# Patient Record
Sex: Female | Born: 2000
Health system: Southern US, Community
[De-identification: ages and names within clinical notes are randomized; demographics above are authoritative.]

---
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
## 2019-01-01 DIAGNOSIS — F913 Oppositional defiant disorder: Secondary | ICD-10-CM | POA: Diagnosis not present

## 2019-01-08 DIAGNOSIS — F913 Oppositional defiant disorder: Secondary | ICD-10-CM | POA: Diagnosis not present

## 2019-01-28 DIAGNOSIS — F913 Oppositional defiant disorder: Secondary | ICD-10-CM | POA: Diagnosis not present

## 2019-02-25 DIAGNOSIS — F913 Oppositional defiant disorder: Secondary | ICD-10-CM | POA: Diagnosis not present

## 2019-02-28 DIAGNOSIS — R300 Dysuria: Secondary | ICD-10-CM | POA: Diagnosis not present

## 2019-02-28 DIAGNOSIS — Z202 Contact with and (suspected) exposure to infections with a predominantly sexual mode of transmission: Secondary | ICD-10-CM | POA: Diagnosis not present

## 2019-02-28 DIAGNOSIS — Z6824 Body mass index (BMI) 24.0-24.9, adult: Secondary | ICD-10-CM | POA: Diagnosis not present

## 2019-11-03 ENCOUNTER — Other Ambulatory Visit: Payer: Self-pay

## 2020-05-23 ENCOUNTER — Emergency Department (HOSPITAL_BASED_OUTPATIENT_CLINIC_OR_DEPARTMENT_OTHER)
Admission: EM | Admit: 2020-05-23 | Discharge: 2020-05-23 | Disposition: A | Discharge status: Home / Self Care | Payer: 59 | Attending: Emergency Medicine | Admitting: Emergency Medicine

## 2020-05-23 ENCOUNTER — Other Ambulatory Visit: Payer: Self-pay

## 2020-05-23 ENCOUNTER — Emergency Department (HOSPITAL_BASED_OUTPATIENT_CLINIC_OR_DEPARTMENT_OTHER): Payer: 59

## 2020-05-23 ENCOUNTER — Encounter (HOSPITAL_BASED_OUTPATIENT_CLINIC_OR_DEPARTMENT_OTHER): Payer: Self-pay | Admitting: Emergency Medicine

## 2020-05-23 DIAGNOSIS — N3001 Acute cystitis with hematuria: Secondary | ICD-10-CM | POA: Diagnosis not present

## 2020-05-23 DIAGNOSIS — R112 Nausea with vomiting, unspecified: Secondary | ICD-10-CM | POA: Diagnosis present

## 2020-05-23 LAB — CBC WITH DIFFERENTIAL/PLATELET
Abs Immature Granulocytes: 0.09 10*3/uL — ABNORMAL HIGH (ref 0.00–0.07)
Basophils Absolute: 0.1 10*3/uL (ref 0.0–0.1)
Basophils Relative: 0 %
Eosinophils Absolute: 0 10*3/uL (ref 0.0–0.5)
Eosinophils Relative: 0 %
HCT: 39.1 % (ref 36.0–46.0)
Hemoglobin: 13.7 g/dL (ref 12.0–15.0)
Immature Granulocytes: 1 %
Lymphocytes Relative: 3 %
Lymphs Abs: 0.6 10*3/uL — ABNORMAL LOW (ref 0.7–4.0)
MCH: 31.3 pg (ref 26.0–34.0)
MCHC: 35 g/dL (ref 30.0–36.0)
MCV: 89.3 fL (ref 80.0–100.0)
Monocytes Absolute: 0.3 10*3/uL (ref 0.1–1.0)
Monocytes Relative: 2 %
Neutro Abs: 17.8 10*3/uL — ABNORMAL HIGH (ref 1.7–7.7)
Neutrophils Relative %: 94 %
Platelets: 308 10*3/uL (ref 150–400)
RBC: 4.38 MIL/uL (ref 3.87–5.11)
RDW: 11.7 % (ref 11.5–15.5)
WBC: 18.8 10*3/uL — ABNORMAL HIGH (ref 4.0–10.5)
nRBC: 0 % (ref 0.0–0.2)

## 2020-05-23 LAB — LIPASE, BLOOD: Lipase: <10 U/L — ABNORMAL LOW (ref 11–51)

## 2020-05-23 LAB — URINALYSIS, ROUTINE W REFLEX MICROSCOPIC
Bilirubin Urine: NEGATIVE
Glucose, UA: NEGATIVE mg/dL
Ketones, ur: >80 mg/dL — AB
Nitrite: POSITIVE — AB
Protein, ur: 100 mg/dL — AB
Specific Gravity, Urine: 1.03 (ref 1.005–1.030)
WBC, UA: >50 WBC/hpf — ABNORMAL HIGH (ref 0–5)
pH: 8 (ref 5.0–8.0)

## 2020-05-23 LAB — COMPREHENSIVE METABOLIC PANEL
ALT: 22 U/L (ref 0–44)
AST: 21 U/L (ref 15–41)
Albumin: 5 g/dL (ref 3.5–5.0)
Alkaline Phosphatase: 31 U/L — ABNORMAL LOW (ref 38–126)
Anion gap: 16 — ABNORMAL HIGH (ref 5–15)
BUN: 12 mg/dL (ref 6–20)
CO2: 19 mmol/L — ABNORMAL LOW (ref 22–32)
Calcium: 10 mg/dL (ref 8.9–10.3)
Chloride: 101 mmol/L (ref 98–111)
Creatinine, Ser: 0.67 mg/dL (ref 0.44–1.00)
GFR, Estimated: >60 mL/min (ref >60)
Glucose, Bld: 138 mg/dL — ABNORMAL HIGH (ref 70–99)
Potassium: 3.5 mmol/L (ref 3.5–5.1)
Sodium: 136 mmol/L (ref 135–145)
Total Bilirubin: 1.4 mg/dL — ABNORMAL HIGH (ref 0.3–1.2)
Total Protein: 7.3 g/dL (ref 6.5–8.1)

## 2020-05-23 LAB — PREGNANCY, URINE: Preg Test, Ur: NEGATIVE

## 2020-05-23 MED ORDER — PHENAZOPYRIDINE HCL 200 MG PO TABS: 200.0000 mg | ORAL_TABLET | Freq: Three times a day (TID) | ORAL | 0 refills | Status: AC

## 2020-05-23 MED ORDER — SODIUM CHLORIDE 0.9 % IV SOLN: 2.0000 g | Freq: Once | INTRAVENOUS | Status: DC

## 2020-05-23 MED ORDER — KETOROLAC TROMETHAMINE 30 MG/ML IJ SOLN
30.0000 mg | Freq: Once | INTRAMUSCULAR | Status: AC
  Administered 2020-05-23: 30 mg via INTRAVENOUS
  Filled 2020-05-23: qty 1

## 2020-05-23 MED ORDER — SODIUM CHLORIDE 0.9 % IV SOLN
1.0000 g | Freq: Once | INTRAVENOUS | Status: AC
  Administered 2020-05-23: 1 g via INTRAVENOUS
  Filled 2020-05-23: qty 10

## 2020-05-23 MED ORDER — PROCHLORPERAZINE EDISYLATE 10 MG/2ML IJ SOLN
10.0000 mg | Freq: Once | INTRAMUSCULAR | Status: AC
  Administered 2020-05-23: 10 mg via INTRAVENOUS
  Filled 2020-05-23: qty 2

## 2020-05-23 MED ORDER — SODIUM CHLORIDE 0.9 % IV BOLUS
1000.0000 mL | Freq: Once | INTRAVENOUS | Status: AC
  Administered 2020-05-23: 1000 mL via INTRAVENOUS

## 2020-05-23 MED ORDER — ONDANSETRON HCL 4 MG/2ML IJ SOLN
4.0000 mg | Freq: Once | INTRAMUSCULAR | Status: AC
  Administered 2020-05-23: 4 mg via INTRAVENOUS
  Filled 2020-05-23: qty 2

## 2020-05-23 MED ORDER — ONDANSETRON HCL 4 MG PO TABS: 4.0000 mg | ORAL_TABLET | Freq: Four times a day (QID) | ORAL | 0 refills | Status: DC | PRN

## 2020-05-23 MED ORDER — CEPHALEXIN 500 MG PO CAPS: 500.0000 mg | ORAL_CAPSULE | Freq: Two times a day (BID) | ORAL | 0 refills | Status: AC

## 2020-05-23 MED ORDER — IOHEXOL 300 MG/ML  SOLN
100.0000 mL | Freq: Once | INTRAMUSCULAR | Status: AC | PRN
  Administered 2020-05-23: 100 mL via INTRAVENOUS

## 2020-05-23 NOTE — Discharge Instructions (Signed)
Continue antibiotics as prescribed.  Use Zofran as needed for nausea and vomiting.  Recommend continued use of Tylenol Motrin for pain.  Please return to the ED if you have worsening pain, nausea, vomiting, develop persistent fever.  Follow-up with your primary care doctor or OB/GYN if you have persistent symptoms.

## 2020-05-23 NOTE — ED Provider Notes (Signed)
[facility] EMERGENCY DEPT Provider Note   CSN: [id number] Arrival date & time: 05/23/20  1936     History Chief Complaint  Patient presents with  . Emesis  . Nausea    Dawn Esparza is a 20 y.o. female.  The history is provided by the patient.  Emesis Severity:  Mild Duration:  4 hours Timing:  Constant Quality:  Undigested food Progression:  Unchanged Chronicity:  New Relieved by:  Nothing Worsened by:  Nothing Associated symptoms: abdominal pain   Associated symptoms: no arthralgias, no chills, no cough, no diarrhea, no fever, no headaches, no myalgias, no sore throat and no URI   Risk factors: no diabetes, not pregnant, no sick contacts and no suspect food intake        History reviewed. No pertinent past medical history.  There are no problems to display for this patient.     OB History   No obstetric history on file.     No family history on file.  Social History   Tobacco Use  . Smoking status: Never Smoker  . Smokeless tobacco: Never Used  Vaping Use  . Vaping Use: Never used  Substance Use Topics  . Alcohol use: Not Currently  . Drug use: Never    Home Medications Prior to Admission medications   Medication Sig Start Date End Date Taking? Authorizing Provider  cephALEXin (KEFLEX) 500 MG capsule Take 1 capsule (500 mg total) by mouth 2 (two) times daily for 7 days. 05/23/20 05/30/20 Yes Curatolo, Adam, DO  ondansetron (ZOFRAN) 4 MG tablet Take 1 tablet (4 mg total) by mouth every 6 (six) hours as needed for up to 15 doses for nausea or vomiting. 05/23/20  Yes Curatolo, Adam, DO    Allergies    Patient has no known allergies.  Review of Systems   Review of Systems  Constitutional: Negative for chills and fever.  HENT: Negative for ear pain and sore throat.   Eyes: Negative for pain and visual disturbance.  Respiratory: Negative for cough and shortness of breath.   Cardiovascular: Negative for chest pain and palpitations.   Gastrointestinal: Positive for abdominal pain, nausea and vomiting. Negative for abdominal distention, anal bleeding, blood in stool, constipation and diarrhea.  Genitourinary: Positive for dysuria, frequency and urgency. Negative for decreased urine volume, flank pain, hematuria, pelvic pain, vaginal bleeding, vaginal discharge and vaginal pain.  Musculoskeletal: Negative for arthralgias, back pain and myalgias.  Skin: Negative for color change and rash.  Neurological: Negative for seizures, syncope and headaches.  All other systems reviewed and are negative.   Physical Exam Updated Vital Signs  ED Triage Vitals  Enc Vitals Group     BP 05/23/20 1947 124/64     Pulse --      Resp 05/23/20 1947 18     Temp 05/23/20 1947 98.3 F (36.8 C)     Temp Source 05/23/20 1947 Oral     SpO2 05/23/20 1947 100 %     Weight 05/23/20 1946 130 lb (59 kg)     Height 05/23/20 1946 5\' 4"  (1.626 m)     Head Circumference --      Peak Flow --      Pain Score 05/23/20 1945 8     Pain Loc --      Pain Edu? --      Excl. in GC? --     Physical Exam Vitals and nursing note reviewed.  Constitutional:      General: She is  not in acute distress.    Appearance: She is well-developed. She is ill-appearing.  HENT:     Head: Normocephalic and atraumatic.     Mouth/Throat:     Mouth: Mucous membranes are moist.  Eyes:     Conjunctiva/sclera: Conjunctivae normal.     Pupils: Pupils are equal, round, and reactive to light.  Cardiovascular:     Rate and Rhythm: Normal rate and regular rhythm.     Pulses: Normal pulses.     Heart sounds: Normal heart sounds. No murmur heard.   Pulmonary:     Effort: Pulmonary effort is normal. No respiratory distress.     Breath sounds: Normal breath sounds.  Abdominal:     Palpations: Abdomen is soft.     Tenderness: There is abdominal tenderness (TTP RLQ/suprapubic/LLQ).  Musculoskeletal:     Cervical back: Normal range of motion and neck supple.  Skin:     General: Skin is warm and dry.     Capillary Refill: Capillary refill takes less than 2 seconds.  Neurological:     General: No focal deficit present.     Mental Status: She is alert.  Psychiatric:        Mood and Affect: Mood normal.     ED Results / Procedures / Treatments   Labs (all labs ordered are listed, but only abnormal results are displayed) Labs Reviewed  CBC WITH DIFFERENTIAL/PLATELET - Abnormal; Notable for the following components:      Result Value   WBC 18.8 (*)    Neutro Abs 17.8 (*)    Lymphs Abs 0.6 (*)    Abs Immature Granulocytes 0.09 (*)    All other components within normal limits  COMPREHENSIVE METABOLIC PANEL - Abnormal; Notable for the following components:   CO2 19 (*)    Glucose, Bld 138 (*)    Alkaline Phosphatase 31 (*)    Total Bilirubin 1.4 (*)    Anion gap 16 (*)    All other components within normal limits  LIPASE, BLOOD - Abnormal; Notable for the following components:   Lipase <10 (*)    All other components within normal limits  URINALYSIS, ROUTINE W REFLEX MICROSCOPIC - Abnormal; Notable for the following components:   APPearance CLOUDY (*)    Hgb urine dipstick MODERATE (*)    Ketones, ur >80 (*)    Protein, ur 100 (*)    Nitrite POSITIVE (*)    Leukocytes,Ua LARGE (*)    WBC, UA >50 (*)    Bacteria, UA MANY (*)    All other components within normal limits  URINE CULTURE  PREGNANCY, URINE    EKG None  Radiology CT ABDOMEN PELVIS W CONTRAST  Result Date: 05/23/2020 CLINICAL DATA:  Nausea and vomiting. EXAM: CT ABDOMEN AND PELVIS WITH CONTRAST TECHNIQUE: Multidetector CT imaging of the abdomen and pelvis was performed using the standard protocol following bolus administration of intravenous contrast. CONTRAST:  OMNIPAQUE IOHEXOL 300 MG/ML  SOLN COMPARISON:  None. FINDINGS: Lower chest: No acute abnormality. Hepatobiliary: No focal liver abnormality is seen. No gallstones, gallbladder wall thickening, or biliary dilatation.  Pancreas: Unremarkable. No pancreatic ductal dilatation or surrounding inflammatory changes. Spleen: Normal in size without focal abnormality. Adrenals/Urinary Tract: Adrenal glands are unremarkable. Kidneys are normal, without renal calculi, focal lesion, or hydronephrosis. The urinary bladder is partially contracted and subsequently limited in evaluation. Stomach/Bowel: Stomach is within normal limits. Appendix appears normal. No evidence of bowel wall thickening, distention, or inflammatory changes. Vascular/Lymphatic: No significant  vascular findings are present. No enlarged abdominal or pelvic lymph nodes. Reproductive: An IUD is seen within an otherwise normal appearing uterus. The bilateral adnexa are unremarkable. Other: No abdominal wall hernia or abnormality. A small amount of posterior pelvic fluid is seen. Musculoskeletal: No acute or significant osseous findings. IMPRESSION: 1. Small amount of posterior pelvic fluid, likely physiologic. 2. IUD in place. Electronically Signed   By: Aram Candela M.D.   On: 05/23/2020 20:59    Procedures Procedures   Medications Ordered in ED Medications  ondansetron (ZOFRAN) injection 4 mg (4 mg Intravenous Given 05/23/20 2004)  sodium chloride 0.9 % bolus 1,000 mL (1,000 mLs Intravenous New Bag/Given 05/23/20 2004)  cefTRIAXone (ROCEPHIN) 1 g in sodium chloride 0.9 % 100 mL IVPB (1 g Intravenous New Bag/Given 05/23/20 2045)  iohexol (OMNIPAQUE) 300 MG/ML solution 100 mL (100 mLs Intravenous Contrast Given 05/23/20 2033)  ketorolac (TORADOL) 30 MG/ML injection 30 mg (30 mg Intravenous Given 05/23/20 2113)  prochlorperazine (COMPAZINE) injection 10 mg (10 mg Intravenous Given 05/23/20 2113)    ED Course  I have reviewed the triage vital signs and the nursing notes.  Pertinent labs & imaging results that were available during my care of the patient were reviewed by me and considered in my medical decision making (see chart for details).    MDM  Rules/Calculators/A&P                          Finlee Concepcion is here with abdominal pain, nausea, vomiting.  UTI symptoms as well.  Has some blood in her urine.  Normal vitals, no fever.  Lower abdominal pain on exam.  Has had nonstop nausea and vomiting for the last 4 hours.  Last mental cycle was about a week ago.  No history of kidney stones.  Appears uncomfortable on exam.  Suspect stone versus cystitis versus appendicitis.  Will get basic labs and CT scan abdomen pelvis.  Seems less likely ovarian cyst or torsion.    Urinalysis consistent with infection.  White count of 18.  However no fever.  No tachycardia.  No concern for sepsis.  Electrolytes are overall unremarkable.  Pregnancy test negative.  CT scan negative for kidney stone or appendicitis.  No pyelonephritis seen on CT scan.  Overall suspect a hemorrhagic cystitis.  Feeling much better after IV fluids, IV pain medication and IV antiemetics.  Will start patient on Keflex.  She got a dose of IV Rocephin while in the ED.  She understands return precautions.  Will discharge also with Zofran.  This chart was dictated using voice recognition software.  Despite best efforts to proofread,  errors can occur which can change the documentation meaning.   Final Clinical Impression(s) / ED Diagnoses Final diagnoses:  Acute cystitis with hematuria    Rx / DC Orders ED Discharge Orders         Ordered    cephALEXin (KEFLEX) 500 MG capsule  2 times daily        05/23/20 2114    ondansetron (ZOFRAN) 4 MG tablet  Every 6 hours PRN        05/23/20 2114           Virgina Norfolk, DO 05/23/20 2116

## 2020-05-23 NOTE — ED Triage Notes (Signed)
  Patient started having N/V earlier today around noon.  Called OB and got prescription for macrobid because she was having UTI symptoms as well.  Patient unable to keep anything down and feeling syncopal.  Pain 8/10 cramping in around umbilicus.

## 2020-05-26 LAB — URINE CULTURE: Culture: >=100000 — AB

## 2020-06-14 ENCOUNTER — Emergency Department (HOSPITAL_BASED_OUTPATIENT_CLINIC_OR_DEPARTMENT_OTHER)
Admission: EM | Admit: 2020-06-14 | Discharge: 2020-06-14 | Disposition: A | Discharge status: Home / Self Care | Payer: 59 | Attending: Emergency Medicine | Admitting: Emergency Medicine

## 2020-06-14 ENCOUNTER — Encounter (HOSPITAL_BASED_OUTPATIENT_CLINIC_OR_DEPARTMENT_OTHER): Payer: Self-pay

## 2020-06-14 ENCOUNTER — Other Ambulatory Visit: Payer: Self-pay

## 2020-06-14 ENCOUNTER — Emergency Department (HOSPITAL_BASED_OUTPATIENT_CLINIC_OR_DEPARTMENT_OTHER): Payer: 59

## 2020-06-14 DIAGNOSIS — R112 Nausea with vomiting, unspecified: Secondary | ICD-10-CM

## 2020-06-14 DIAGNOSIS — R1033 Periumbilical pain: Secondary | ICD-10-CM | POA: Diagnosis not present

## 2020-06-14 LAB — CBC WITH DIFFERENTIAL/PLATELET
Abs Immature Granulocytes: 0.08 10*3/uL — ABNORMAL HIGH (ref 0.00–0.07)
Basophils Absolute: 0 10*3/uL (ref 0.0–0.1)
Basophils Relative: 0 %
Eosinophils Absolute: 0 10*3/uL (ref 0.0–0.5)
Eosinophils Relative: 0 %
HCT: 40 % (ref 36.0–46.0)
Hemoglobin: 13.7 g/dL (ref 12.0–15.0)
Immature Granulocytes: 1 %
Lymphocytes Relative: 6 %
Lymphs Abs: 1.1 10*3/uL (ref 0.7–4.0)
MCH: 30.6 pg (ref 26.0–34.0)
MCHC: 34.3 g/dL (ref 30.0–36.0)
MCV: 89.3 fL (ref 80.0–100.0)
Monocytes Absolute: 0.7 10*3/uL (ref 0.1–1.0)
Monocytes Relative: 4 %
Neutro Abs: 15.4 10*3/uL — ABNORMAL HIGH (ref 1.7–7.7)
Neutrophils Relative %: 89 %
Platelets: 304 10*3/uL (ref 150–400)
RBC: 4.48 MIL/uL (ref 3.87–5.11)
RDW: 11.5 % (ref 11.5–15.5)
WBC: 17.3 10*3/uL — ABNORMAL HIGH (ref 4.0–10.5)
nRBC: 0 % (ref 0.0–0.2)

## 2020-06-14 LAB — URINALYSIS, ROUTINE W REFLEX MICROSCOPIC
Bilirubin Urine: NEGATIVE
Glucose, UA: NEGATIVE mg/dL
Ketones, ur: 40 mg/dL — AB
Nitrite: NEGATIVE
Protein, ur: 30 mg/dL — AB
Specific Gravity, Urine: 1.017 (ref 1.005–1.030)
pH: 8.5 — ABNORMAL HIGH (ref 5.0–8.0)

## 2020-06-14 LAB — COMPREHENSIVE METABOLIC PANEL
ALT: 15 U/L (ref 0–44)
AST: 17 U/L (ref 15–41)
Albumin: 4.9 g/dL (ref 3.5–5.0)
Alkaline Phosphatase: 36 U/L — ABNORMAL LOW (ref 38–126)
Anion gap: 16 — ABNORMAL HIGH (ref 5–15)
BUN: 9 mg/dL (ref 6–20)
CO2: 20 mmol/L — ABNORMAL LOW (ref 22–32)
Calcium: 10.1 mg/dL (ref 8.9–10.3)
Chloride: 102 mmol/L (ref 98–111)
Creatinine, Ser: 0.66 mg/dL (ref 0.44–1.00)
GFR, Estimated: >60 mL/min (ref >60)
Glucose, Bld: 141 mg/dL — ABNORMAL HIGH (ref 70–99)
Potassium: 3.3 mmol/L — ABNORMAL LOW (ref 3.5–5.1)
Sodium: 138 mmol/L (ref 135–145)
Total Bilirubin: 0.6 mg/dL (ref 0.3–1.2)
Total Protein: 7.5 g/dL (ref 6.5–8.1)

## 2020-06-14 LAB — URINALYSIS, MICROSCOPIC (REFLEX)

## 2020-06-14 LAB — LIPASE, BLOOD: Lipase: <10 U/L — ABNORMAL LOW (ref 11–51)

## 2020-06-14 LAB — PREGNANCY, URINE: Preg Test, Ur: NEGATIVE

## 2020-06-14 MED ORDER — LACTATED RINGERS IV BOLUS
1000.0000 mL | Freq: Once | INTRAVENOUS | Status: AC
  Administered 2020-06-14: 1000 mL via INTRAVENOUS

## 2020-06-14 MED ORDER — METOCLOPRAMIDE HCL 5 MG/ML IJ SOLN
10.0000 mg | Freq: Once | INTRAMUSCULAR | Status: AC
  Administered 2020-06-14: 10 mg via INTRAVENOUS
  Filled 2020-06-14: qty 2

## 2020-06-14 MED ORDER — SODIUM CHLORIDE 0.9 % IV SOLN: 1.0000 g | Freq: Once | INTRAVENOUS | Status: DC

## 2020-06-14 MED ORDER — MORPHINE SULFATE (PF) 4 MG/ML IV SOLN
4.0000 mg | Freq: Once | INTRAVENOUS | Status: AC
  Administered 2020-06-14: 4 mg via INTRAVENOUS
  Filled 2020-06-14: qty 1

## 2020-06-14 MED ORDER — PANTOPRAZOLE SODIUM 20 MG PO TBEC: 20.0000 mg | DELAYED_RELEASE_TABLET | Freq: Every day | ORAL | 0 refills | Status: AC

## 2020-06-14 MED ORDER — IOHEXOL 300 MG/ML  SOLN
75.0000 mL | Freq: Once | INTRAMUSCULAR | Status: AC | PRN
  Administered 2020-06-14: 75 mL via INTRAVENOUS

## 2020-06-14 MED ORDER — HALOPERIDOL LACTATE 5 MG/ML IJ SOLN
2.0000 mg | Freq: Once | INTRAMUSCULAR | Status: AC
  Administered 2020-06-14: 2 mg via INTRAVENOUS
  Filled 2020-06-14: qty 1

## 2020-06-14 NOTE — ED Provider Notes (Signed)
[facility] EMERGENCY DEPT Provider Note   CSN: [id number] Arrival date & time: 06/14/20  1548     History Chief Complaint  Patient presents with  . Nausea  . Emesis    Dawn Esparza is a 20 y.o. female.  Patient is a 20 year old female with no significant past medical history presenting today due to periumbilical abdominal pain nausea and vomiting that started approximately 2 hours ago.  Patient reports when she woke up this morning she did not feel quite right but was not having any significant pain.  She went to a brewery today and had to beers and started feeling worse.  2 hours ago she started having severe periumbilical pain and has vomited more than 15 times.  She did have one hard stool prior to arrival but denies a history of constipation.  Usually has a bowel movement almost daily.  She does report very similar symptoms exactly 1 month ago and at that time was found to have a UTI but denies any vaginal discharge, dysuria, frequency or urgency.  She has no flank pain.  She has not had fever.  LMP was 1 week ago.  Patient denies any marijuana or drug use.  She does not use tobacco products.  No prior abdominal surgeries.   Emesis      History reviewed. No pertinent past medical history.  There are no problems to display for this patient.   History reviewed. No pertinent surgical history.   OB History   No obstetric history on file.     History reviewed. No pertinent family history.  Social History   Tobacco Use  . Smoking status: Never Smoker  . Smokeless tobacco: Never Used  Vaping Use  . Vaping Use: Never used  Substance Use Topics  . Alcohol use: Not Currently  . Drug use: Never    Home Medications Prior to Admission medications   Medication Sig Start Date End Date Taking? Authorizing Provider  ondansetron (ZOFRAN) 4 MG tablet Take 1 tablet (4 mg total) by mouth every 6 (six) hours as needed for up to 15 doses for nausea or vomiting.  05/23/20   Curatolo, Adam, DO  phenazopyridine (PYRIDIUM) 200 MG tablet Take 1 tablet (200 mg total) by mouth 3 (three) times daily. 05/23/20   Virgina Norfolk, DO    Allergies    Patient has no known allergies.  Review of Systems   Review of Systems  Gastrointestinal: Positive for vomiting.  All other systems reviewed and are negative.   Physical Exam Updated Vital Signs BP 119/81 (BP Location: Right Arm)   Pulse 85   Temp 98.4 F (36.9 C) (Oral)   Resp 18   LMP 05/16/2020 Comment: neg preg test  SpO2 100%   Physical Exam Vitals and nursing note reviewed.  Constitutional:      General: She is not in acute distress.    Appearance: Normal appearance. She is well-developed and normal weight.  HENT:     Head: Normocephalic and atraumatic.     Mouth/Throat:     Mouth: Mucous membranes are moist.  Eyes:     Pupils: Pupils are equal, round, and reactive to light.  Cardiovascular:     Rate and Rhythm: Normal rate and regular rhythm.     Heart sounds: Normal heart sounds. No murmur heard. No friction rub.  Pulmonary:     Effort: Pulmonary effort is normal.     Breath sounds: Normal breath sounds. No wheezing or rales.  Abdominal:  General: Bowel sounds are normal. There is no distension.     Palpations: Abdomen is soft.     Tenderness: There is abdominal tenderness in the epigastric area and periumbilical area. There is no right CVA tenderness, left CVA tenderness, guarding or rebound.  Musculoskeletal:        General: No tenderness. Normal range of motion.     Right lower leg: No edema.     Left lower leg: No edema.     Comments: No edema  Skin:    General: Skin is warm and dry.     Findings: No rash.  Neurological:     Mental Status: She is alert and oriented to person, place, and time. Mental status is at baseline.     Cranial Nerves: No cranial nerve deficit.  Psychiatric:        Mood and Affect: Mood normal.        Behavior: Behavior normal.        Thought  Content: Thought content normal.     ED Results / Procedures / Treatments   Labs (all labs ordered are listed, but only abnormal results are displayed) Labs Reviewed  URINALYSIS, ROUTINE W REFLEX MICROSCOPIC - Abnormal; Notable for the following components:      Result Value   APPearance HAZY (*)    pH 8.5 (*)    Hgb urine dipstick MODERATE (*)    Ketones, ur 40 (*)    Protein, ur 30 (*)    Leukocytes,Ua LARGE (*)    All other components within normal limits  CBC WITH DIFFERENTIAL/PLATELET - Abnormal; Notable for the following components:   WBC 17.3 (*)    Neutro Abs 15.4 (*)    Abs Immature Granulocytes 0.08 (*)    All other components within normal limits  COMPREHENSIVE METABOLIC PANEL - Abnormal; Notable for the following components:   Potassium 3.3 (*)    CO2 20 (*)    Glucose, Bld 141 (*)    Alkaline Phosphatase 36 (*)    Anion gap 16 (*)    All other components within normal limits  LIPASE, BLOOD - Abnormal; Notable for the following components:   Lipase <10 (*)    All other components within normal limits  URINALYSIS, MICROSCOPIC (REFLEX) - Abnormal; Notable for the following components:   Bacteria, UA MANY (*)    Crystals AMORPHOUS URATES/PHOSPHATES (*)    All other components within normal limits  URINE CULTURE  PREGNANCY, URINE    EKG EKG Interpretation  Date/Time:  Thursday June 14 2020 19:47:15 EDT Ventricular Rate:  91 PR Interval:  172 QRS Duration: 103 QT Interval:  389 QTC Calculation: 479 R Axis:   98 Text Interpretation: Sinus rhythm Consider right ventricular hypertrophy No previous tracing Confirmed by Gwyneth SproutPlunkett, Manessa Buley ([id number]) on 06/14/2020 10:34:23 PM   Radiology CT ABDOMEN PELVIS W CONTRAST  Result Date: 06/14/2020 CLINICAL DATA:  Epigastric pain EXAM: CT ABDOMEN AND PELVIS WITH CONTRAST TECHNIQUE: Multidetector CT imaging of the abdomen and pelvis was performed using the standard protocol following bolus administration of intravenous  contrast. CONTRAST:  75mL OMNIPAQUE IOHEXOL 300 MG/ML  SOLN COMPARISON:  05/23/2020 FINDINGS: Lower chest: Lung bases are clear. No effusions. Heart is normal size. Hepatobiliary: No focal hepatic abnormality. Gallbladder unremarkable. Pancreas: No focal abnormality or ductal dilatation. Spleen: No focal abnormality.  Normal size. Adrenals/Urinary Tract: No adrenal abnormality. No focal renal abnormality. No stones or hydronephrosis. Urinary bladder is unremarkable. Stomach/Bowel: Moderate stool burden in the colon. Normal appendix. Stomach,  large and small bowel grossly unremarkable. Vascular/Lymphatic: No evidence of aneurysm or adenopathy. Reproductive: IUD in the uterus.  No adnexal mass. Other: No free fluid or free air. Musculoskeletal: No acute bony abnormality. IMPRESSION: Moderate stool burden throughout the colon. No acute findings in the abdomen or pelvis. Electronically Signed   By: Charlett Nose M.D.   On: 06/14/2020 21:22    Procedures Procedures   Medications Ordered in ED Medications  morphine 4 MG/ML injection 4 mg (has no administration in time range)  metoCLOPramide (REGLAN) injection 10 mg (has no administration in time range)  lactated ringers bolus 1,000 mL (has no administration in time range)    ED Course  I have reviewed the triage vital signs and the nursing notes.  Pertinent labs & imaging results that were available during my care of the patient were reviewed by me and considered in my medical decision making (see chart for details).    MDM Rules/Calculators/A&P                          Patient presenting with 2 hours of ongoing vomiting and periumbilical pain.  Patient with similar symptoms almost 1 month ago at that time was found to have a UTI.  She did have leftover Zofran at home that she attempted to take but it did not improve her symptoms.  She has no lower abdominal pain today and denies any vaginal or urinary complaints.  She denies use of marijuana to  suggest cannabis hyperemesis syndrome.  Will recheck UA given recent UTI she did take the antibiotics and completed the course.  Lower suspicion for PID or acute pelvic infection as she has no lower abdominal symptoms.  Also this could be viral versus gastritis.  Patient did have 2 alcoholic beverages today but was already not feeling well prior to drinking 2 beers.  She has clear light yellow-colored emesis but no blood in the emesis.  Will give IV fluids, pain and nausea control.  Labs are pending.  7:28 PM Patient has a leukocytosis of 17,000 and otherwise CMP was relatively normal except for an anion gap of 16.  She initially had Reglan and IV fluids as well as pain medication.  She was okay for approximately 3 hours but now reports she is starting to get nausea and abdominal pain again.  We will try Haldol for symptoms.  Urine is still pending.  7:50 PM EKG with QT of 389.  Will give 2mg  of haldol.  Patient's UA with many bacteria but only 6-10 white cells and does appear contaminated.  She did have moderate blood and a CT was ordered but no acute findings.  After receiving the second dose of pain medication and Haldol on repeat evaluation patient is now starting to feel better.  She is sipping on some liquids and feel that she will be stable for discharge.  Will start on pantoprazole as patient reports she was given that a year ago but then never followed up.  Also will give referral to gastroenterology.  MDM Number of Diagnoses or Management Options   Amount and/or Complexity of Data Reviewed Clinical lab tests: ordered and reviewed Tests in the radiology section of CPT: ordered and reviewed Tests in the medicine section of CPT: ordered and reviewed Decide to obtain previous medical records or to obtain history from someone other than the patient: yes Obtain history from someone other than the patient: yes Review and summarize past medical records:  yes Discuss the patient with other  providers: no Independent visualization of images, tracings, or specimens: yes  Risk of Complications, Morbidity, and/or Mortality Presenting problems: moderate Diagnostic procedures: moderate Management options: moderate  Patient Progress Patient progress: improved   Final Clinical Impression(s) / ED Diagnoses Final diagnoses:  Non-intractable vomiting with nausea, unspecified vomiting type    Rx / DC Orders ED Discharge Orders         Ordered    pantoprazole (PROTONIX) 20 MG tablet  Daily        06/14/20 2248           Gwyneth Sprout, MD 06/14/20 2249

## 2020-06-14 NOTE — ED Triage Notes (Signed)
Pt reports having n/v with abd pain to the epigastric area.

## 2020-06-17 LAB — URINE CULTURE: Culture: >=100000 — AB

## 2020-06-18 ENCOUNTER — Telehealth: Payer: Self-pay | Admitting: *Deleted

## 2020-06-18 NOTE — Telephone Encounter (Signed)
Post ED Visit - Positive Culture Follow-up  Culture report reviewed by antimicrobial stewardship pharmacist: [facility] []  , Pharm.D. []  Enzo Bi, Pharm.D., BCPS AQ-ID []  , Pharm.D., BCPS []  Celedonio Miyamoto, Pharm.D., BCPS []  [place], Garvin Fila.D., BCPS, AAHIVP []  , Pharm.D., BCPS, AAHIVP []  Georgina Pillion, PharmD, BCPS []  , PharmD, BCPS []  [place], PharmD, BCPS []  [place], PharmD []  , PharmD, BCPS []  Estella Husk, PharmD  Pharmacy Team []  Lysle Pearl, PharmD []  , PharmD []  Phillips Climes, PharmD []  , Rph []  Agapito Games) , PharmD []  Verlan Friends, PharmD []  , PharmD []  Mervyn Gay, PharmD []  , PharmD []  Vinnie Level, PharmD []  [facility] [employer], PharmD []  , PharmD []  Len Childs, PharmD   Positive urine culture No UIT symptoms, likely gastritis, referred to gastroenterology and no further patient follow-up is required at this time.  [facility] 06/18/2020, 10:15 AM

## 2020-07-02 ENCOUNTER — Other Ambulatory Visit: Payer: Self-pay | Admitting: Gastroenterology

## 2020-07-02 DIAGNOSIS — R1013 Epigastric pain: Secondary | ICD-10-CM

## 2020-07-02 DIAGNOSIS — R140 Abdominal distension (gaseous): Secondary | ICD-10-CM

## 2020-07-23 ENCOUNTER — Ambulatory Visit
Admission: RE | Admit: 2020-07-23 | Discharge: 2020-07-23 | Disposition: A | Discharge status: Home / Self Care | Payer: 59 | Source: Ambulatory Visit | Attending: Gastroenterology | Admitting: Gastroenterology

## 2020-07-23 ENCOUNTER — Other Ambulatory Visit: Payer: Self-pay | Admitting: Gastroenterology

## 2020-07-23 DIAGNOSIS — R1013 Epigastric pain: Secondary | ICD-10-CM

## 2020-07-23 DIAGNOSIS — R140 Abdominal distension (gaseous): Secondary | ICD-10-CM

## 2021-01-02 ENCOUNTER — Encounter (HOSPITAL_BASED_OUTPATIENT_CLINIC_OR_DEPARTMENT_OTHER): Payer: Self-pay

## 2021-01-02 ENCOUNTER — Other Ambulatory Visit: Payer: Self-pay

## 2021-01-02 DIAGNOSIS — N390 Urinary tract infection, site not specified: Secondary | ICD-10-CM | POA: Diagnosis not present

## 2021-01-02 DIAGNOSIS — R112 Nausea with vomiting, unspecified: Secondary | ICD-10-CM | POA: Diagnosis present

## 2021-01-02 LAB — COMPREHENSIVE METABOLIC PANEL
ALT: 17 U/L (ref 0–44)
AST: 17 U/L (ref 15–41)
Albumin: 5 g/dL (ref 3.5–5.0)
Alkaline Phosphatase: 35 U/L — ABNORMAL LOW (ref 38–126)
Anion gap: 13 (ref 5–15)
BUN: 10 mg/dL (ref 6–20)
CO2: 23 mmol/L (ref 22–32)
Calcium: 10.3 mg/dL (ref 8.9–10.3)
Chloride: 101 mmol/L (ref 98–111)
Creatinine, Ser: 0.66 mg/dL (ref 0.44–1.00)
GFR, Estimated: >60 mL/min (ref >60)
Glucose, Bld: 167 mg/dL — ABNORMAL HIGH (ref 70–99)
Potassium: 3.5 mmol/L (ref 3.5–5.1)
Sodium: 137 mmol/L (ref 135–145)
Total Bilirubin: 0.8 mg/dL (ref 0.3–1.2)
Total Protein: 7.5 g/dL (ref 6.5–8.1)

## 2021-01-02 LAB — CBC
HCT: 39 % (ref 36.0–46.0)
Hemoglobin: 13.7 g/dL (ref 12.0–15.0)
MCH: 31.5 pg (ref 26.0–34.0)
MCHC: 35.1 g/dL (ref 30.0–36.0)
MCV: 89.7 fL (ref 80.0–100.0)
Platelets: 321 10*3/uL (ref 150–400)
RBC: 4.35 MIL/uL (ref 3.87–5.11)
RDW: 11.8 % (ref 11.5–15.5)
WBC: 17.8 10*3/uL — ABNORMAL HIGH (ref 4.0–10.5)
nRBC: 0 % (ref 0.0–0.2)

## 2021-01-02 LAB — URINALYSIS, ROUTINE W REFLEX MICROSCOPIC
Bilirubin Urine: NEGATIVE
Glucose, UA: NEGATIVE mg/dL
Hgb urine dipstick: NEGATIVE
Ketones, ur: >80 mg/dL — AB
Leukocytes,Ua: NEGATIVE
Nitrite: POSITIVE — AB
Protein, ur: 30 mg/dL — AB
Specific Gravity, Urine: 1.031 — ABNORMAL HIGH (ref 1.005–1.030)
pH: 8.5 — ABNORMAL HIGH (ref 5.0–8.0)

## 2021-01-02 LAB — PREGNANCY, URINE: Preg Test, Ur: NEGATIVE

## 2021-01-02 LAB — LIPASE, BLOOD: Lipase: <10 U/L — ABNORMAL LOW (ref 11–51)

## 2021-01-02 MED ORDER — ONDANSETRON 4 MG PO TBDP: 4.0000 mg | ORAL_TABLET | Freq: Once | ORAL | Status: DC | PRN

## 2021-01-02 MED ORDER — ONDANSETRON HCL 4 MG/2ML IJ SOLN
4.0000 mg | Freq: Once | INTRAMUSCULAR | Status: AC
  Administered 2021-01-02: 4 mg via INTRAVENOUS
  Filled 2021-01-02: qty 2

## 2021-01-02 NOTE — ED Triage Notes (Signed)
Patient here POV from Home with Father for Nausea, Vomiting.  Patient states Symptoms began approximately 6-7 hours PTA. Associated with Mid-ABD Pain.   No Fevers, NAD Noted during Triage. A&Ox4. GCS 15. No CP or SOB.

## 2021-01-03 ENCOUNTER — Emergency Department (HOSPITAL_BASED_OUTPATIENT_CLINIC_OR_DEPARTMENT_OTHER)
Admission: EM | Admit: 2021-01-03 | Discharge: 2021-01-03 | Disposition: A | Discharge status: Home / Self Care | Payer: 59 | Attending: Emergency Medicine | Admitting: Emergency Medicine

## 2021-01-03 ENCOUNTER — Emergency Department (HOSPITAL_BASED_OUTPATIENT_CLINIC_OR_DEPARTMENT_OTHER): Payer: 59

## 2021-01-03 ENCOUNTER — Encounter (HOSPITAL_BASED_OUTPATIENT_CLINIC_OR_DEPARTMENT_OTHER): Payer: Self-pay | Admitting: Radiology

## 2021-01-03 DIAGNOSIS — R109 Unspecified abdominal pain: Secondary | ICD-10-CM

## 2021-01-03 DIAGNOSIS — N390 Urinary tract infection, site not specified: Secondary | ICD-10-CM

## 2021-01-03 DIAGNOSIS — R112 Nausea with vomiting, unspecified: Secondary | ICD-10-CM

## 2021-01-03 MED ORDER — ALUM & MAG HYDROXIDE-SIMETH 200-200-20 MG/5ML PO SUSP
30.0000 mL | Freq: Once | ORAL | Status: AC
  Administered 2021-01-03: 30 mL via ORAL
  Filled 2021-01-03: qty 30

## 2021-01-03 MED ORDER — HALOPERIDOL LACTATE 5 MG/ML IJ SOLN
2.0000 mg | Freq: Once | INTRAMUSCULAR | Status: AC
  Administered 2021-01-03: 2 mg via INTRAVENOUS
  Filled 2021-01-03: qty 1

## 2021-01-03 MED ORDER — HYOSCYAMINE SULFATE 0.125 MG SL SUBL
0.2500 mg | SUBLINGUAL_TABLET | Freq: Once | SUBLINGUAL | Status: AC
  Administered 2021-01-03: 0.25 mg via SUBLINGUAL
  Filled 2021-01-03: qty 2

## 2021-01-03 MED ORDER — SODIUM CHLORIDE 0.9 % IV BOLUS
1000.0000 mL | Freq: Once | INTRAVENOUS | Status: AC
  Administered 2021-01-03: 1000 mL via INTRAVENOUS

## 2021-01-03 MED ORDER — METOCLOPRAMIDE HCL 5 MG/ML IJ SOLN
10.0000 mg | Freq: Once | INTRAMUSCULAR | Status: AC
  Administered 2021-01-03: 10 mg via INTRAVENOUS
  Filled 2021-01-03: qty 2

## 2021-01-03 MED ORDER — ONDANSETRON 4 MG PO TBDP: 4.0000 mg | ORAL_TABLET | Freq: Three times a day (TID) | ORAL | 0 refills | Status: AC | PRN

## 2021-01-03 MED ORDER — IOHEXOL 300 MG/ML  SOLN
100.0000 mL | Freq: Once | INTRAMUSCULAR | Status: AC | PRN
  Administered 2021-01-03: 75 mL via INTRAVENOUS

## 2021-01-03 MED ORDER — FENTANYL CITRATE PF 50 MCG/ML IJ SOSY
50.0000 ug | PREFILLED_SYRINGE | Freq: Once | INTRAMUSCULAR | Status: AC
  Administered 2021-01-03: 50 ug via INTRAVENOUS
  Filled 2021-01-03: qty 1

## 2021-01-03 MED ORDER — CEPHALEXIN 500 MG PO CAPS: 500.0000 mg | ORAL_CAPSULE | Freq: Three times a day (TID) | ORAL | 0 refills | Status: AC

## 2021-01-03 NOTE — ED Provider Notes (Signed)
[facility] EMERGENCY DEPT Provider Note  CSN: [id number] Arrival date & time: 01/02/21 2141  Chief Complaint(s) Abdominal Pain  HPI Dawn Esparza is a 20 y.o. female    Abdominal Pain Pain location:  Generalized Pain quality: cramping   Pain severity:  Moderate Onset quality:  Gradual Duration:  7 hours Timing:  Constant Progression:  Waxing and waning Chronicity:  Recurrent Context: retching   Context: not alcohol use, not diet changes, not recent illness, not sick contacts, not suspicious food intake and not trauma   Context comment:  THC use Relieved by:  Nothing Worsened by:  Vomiting Associated symptoms: nausea and vomiting   Associated symptoms: no constipation, no cough, no dysuria, no fatigue, no fever and no shortness of breath    Past Medical History History reviewed. No pertinent past medical history. There are no problems to display for this patient.  Home Medication(s) Prior to Admission medications   Medication Sig Start Date End Date Taking? Authorizing Provider  cephALEXin (KEFLEX) 500 MG capsule Take 1 capsule (500 mg total) by mouth 3 (three) times daily for 7 days. 01/03/21 01/10/21 Yes Anthoni Geerts, Amadeo Garnet, MD  ondansetron (ZOFRAN ODT) 4 MG disintegrating tablet Take 1 tablet (4 mg total) by mouth every 8 (eight) hours as needed for up to 3 days for nausea or vomiting. 01/03/21 01/06/21 Yes Yarisbel Miranda, Amadeo Garnet, MD  pantoprazole (PROTONIX) 20 MG tablet Take 1 tablet (20 mg total) by mouth daily. 06/14/20   Gwyneth Sprout, MD  phenazopyridine (PYRIDIUM) 200 MG tablet Take 1 tablet (200 mg total) by mouth 3 (three) times daily. 05/23/20   Virgina Norfolk, DO                                                                                                                                    Past Surgical History History reviewed. No pertinent surgical history. Family History No family history on file.  Social History Social History    Tobacco Use   Smoking status: Never   Smokeless tobacco: Never  Vaping Use   Vaping Use: Never used  Substance Use Topics   Alcohol use: Not Currently   Drug use: Yes    Types: Marijuana    Comment: Daily   Allergies Patient has no known allergies.  Review of Systems Review of Systems  Constitutional:  Negative for fatigue and fever.  Respiratory:  Negative for cough and shortness of breath.   Gastrointestinal:  Positive for abdominal pain, nausea and vomiting. Negative for constipation.  Genitourinary:  Negative for dysuria.  All other systems are reviewed and are negative for acute change except as noted in the HPI  Physical Exam Vital Signs  I have reviewed the triage vital signs BP (!) 123/59 (BP Location: Right Arm)   Pulse 93   Temp 98.7 F (37.1 C) (Oral)   Resp 16   Ht 5\' 4"  (1.626 m)   Wt 59 kg  SpO2 100%   BMI 22.31 kg/m   Physical Exam Vitals reviewed.  Constitutional:      General: She is not in acute distress.    Appearance: She is well-developed. She is not diaphoretic.  HENT:     Head: Normocephalic and atraumatic.     Right Ear: External ear normal.     Left Ear: External ear normal.     Nose: Nose normal.  Eyes:     General: No scleral icterus.    Conjunctiva/sclera: Conjunctivae normal.  Neck:     Trachea: Phonation normal.  Cardiovascular:     Rate and Rhythm: Normal rate and regular rhythm.  Pulmonary:     Effort: Pulmonary effort is normal. No respiratory distress.     Breath sounds: No stridor.  Abdominal:     General: There is no distension.     Tenderness: There is generalized abdominal tenderness. There is no guarding or rebound.  Musculoskeletal:        General: Normal range of motion.     Cervical back: Normal range of motion.  Neurological:     Mental Status: She is alert and oriented to person, place, and time.  Psychiatric:        Behavior: Behavior normal.    ED Results and Treatments Labs (all labs ordered are  listed, but only abnormal results are displayed) Labs Reviewed  LIPASE, BLOOD - Abnormal; Notable for the following components:      Result Value   Lipase <10 (*)    All other components within normal limits  COMPREHENSIVE METABOLIC PANEL - Abnormal; Notable for the following components:   Glucose, Bld 167 (*)    Alkaline Phosphatase 35 (*)    All other components within normal limits  CBC - Abnormal; Notable for the following components:   WBC 17.8 (*)    All other components within normal limits  URINALYSIS, ROUTINE W REFLEX MICROSCOPIC - Abnormal; Notable for the following components:   Specific Gravity, Urine 1.031 (*)    pH 8.5 (*)    Ketones, ur >80 (*)    Protein, ur 30 (*)    Nitrite POSITIVE (*)    Bacteria, UA RARE (*)    All other components within normal limits  PREGNANCY, URINE                                                                                                                         EKG  EKG Interpretation  Date/Time:  Thursday January 03 2021 04:18:12 EDT Ventricular Rate:  92 PR Interval:  160 QRS Duration: 103 QT Interval:  383 QTC Calculation: 474 R Axis:   90 Text Interpretation: Sinus rhythm Consider right atrial enlargement Consider right ventricular hypertrophy No acute changes Confirmed by Drema Pry [phone]) on 01/03/2021 4:30:48 AM       Radiology CT ABDOMEN PELVIS W CONTRAST  Result Date: 01/03/2021 CLINICAL DATA:  Right lower quadrant abdominal pain. EXAM: CT ABDOMEN AND PELVIS WITH CONTRAST  TECHNIQUE: Multidetector CT imaging of the abdomen and pelvis was performed using the standard protocol following bolus administration of intravenous contrast. CONTRAST:  60mL OMNIPAQUE IOHEXOL 300 MG/ML  SOLN COMPARISON:  Abdominal ultrasound dated 06/14/2020. FINDINGS: Lower chest: The visualized lung bases are clear. No intra-abdominal free air.  Small free fluid within the pelvis. Hepatobiliary: No focal liver abnormality is seen. No  gallstones, gallbladder wall thickening, or biliary dilatation. Pancreas: Unremarkable. No pancreatic ductal dilatation or surrounding inflammatory changes. Spleen: Normal in size without focal abnormality. Adrenals/Urinary Tract: Adrenal glands are unremarkable. Kidneys are normal, without renal calculi, focal lesion, or hydronephrosis. Bladder is unremarkable. Stomach/Bowel: There is no bowel obstruction or active inflammation. The appendix is normal. Vascular/Lymphatic: The abdominal aorta and IVC are unremarkable. No portal venous gas. There is no adenopathy. Reproductive: The uterus is anteverted. An intrauterine device is noted. No adnexal masses. Other: None Musculoskeletal: No acute or significant osseous findings. IMPRESSION: No acute intra-abdominal or pelvic pathology. Normal appendix. Electronically Signed   By: Elgie Collard M.D.   On: 01/03/2021 03:24    Pertinent labs & imaging results that were available during my care of the patient were reviewed by me and considered in my medical decision making (see MDM for details).  Medications Ordered in ED Medications  ondansetron (ZOFRAN) injection 4 mg (4 mg Intravenous Given 01/02/21 2233)  metoCLOPramide (REGLAN) injection 10 mg (10 mg Intravenous Given 01/03/21 0230)  fentaNYL (SUBLIMAZE) injection 50 mcg (50 mcg Intravenous Given 01/03/21 0231)  hyoscyamine (LEVSIN SL) SL tablet 0.25 mg (0.25 mg Sublingual Given 01/03/21 0230)  alum & mag hydroxide-simeth (MAALOX/MYLANTA) 200-200-20 MG/5ML suspension 30 mL (30 mLs Oral Given 01/03/21 0231)  sodium chloride 0.9 % bolus 1,000 mL (1,000 mLs Intravenous New Bag/Given 01/03/21 0228)  iohexol (OMNIPAQUE) 300 MG/ML solution 100 mL (75 mLs Intravenous Contrast Given 01/03/21 0301)  haloperidol lactate (HALDOL) injection 2 mg (2 mg Intravenous Given 01/03/21 0434)                                                                                                                                      Procedures Procedures  (including critical care time)  Medical Decision Making / ED Course I have reviewed the nursing notes for this encounter and the patient's prior records (if available in EHR or on provided paperwork).  Tracey Hermance was evaluated in Emergency Department on 01/03/2021 for the symptoms described in the history of present illness. She was evaluated in the context of the global COVID-19 pandemic, which necessitated consideration that the patient might be at risk for infection with the SARS-CoV-2 virus that causes COVID-19. Institutional protocols and algorithms that pertain to the evaluation of patients at risk for COVID-19 are in a state of rapid change based on information released by regulatory bodies including the CDC and federal and state organizations. These policies and algorithms were followed during the patient's care in the ED.  Diffuse abd pain. Similar to prior presentations.  Pertinent labs & imaging results that were available during my care of the patient were reviewed by me and considered in my medical decision making:  Notable for leukocytosis. No anemia No significant electrolyte derangements or renal sufficiency No evidence of bili obstruction or pancreatitis UA suspicious for urinary tract infection with positive nitrites but patient has denied any symptoms.  CT scan negative for appendicitis or other intra-abdominal inflammatory/infectious process or bowel obstruction..  Treated symptomatically with IV fluids, antiemetics including Haldol which completely resolved patient's symptoms.  She was able to tolerate oral intake.  Will treat for possible urinary tract infection. Discussed likelihood of cyclic vomiting likely due to cannabinoid hyperemesis versus gastritis. Final Clinical Impression(s) / ED Diagnoses Final diagnoses:  Nausea and vomiting in adult  Abdominal cramping  Acute lower UTI   The patient appears reasonably screened  and/or stabilized for discharge and I doubt any other medical condition or other [facility] requiring further screening, evaluation, or treatment in the ED at this time prior to discharge. Safe for discharge with strict return precautions.  Disposition: Discharge  Condition: Good  I have discussed the results, Dx and Tx plan with the patient/family who expressed understanding and agree(s) with the plan. Discharge instructions discussed at length. The patient/family was given strict return precautions who verbalized understanding of the instructions. No further questions at time of discharge.    ED Discharge Orders          Ordered    cephALEXin (KEFLEX) 500 MG capsule  3 times daily        01/03/21 0512    ondansetron (ZOFRAN ODT) 4 MG disintegrating tablet  Every 8 hours PRN        01/03/21 1324            Follow Up: Primary care provider  Call  to schedule an appointment for close follow up     This chart was dictated using voice recognition software.  Despite best efforts to proofread,  errors can occur which can change the documentation meaning.    Nira Conn, MD 01/03/21 0530

## 2021-02-20 ENCOUNTER — Other Ambulatory Visit (HOSPITAL_COMMUNITY): Payer: Self-pay | Admitting: Gastroenterology

## 2021-02-20 ENCOUNTER — Other Ambulatory Visit: Payer: Self-pay | Admitting: Gastroenterology

## 2021-02-20 DIAGNOSIS — R112 Nausea with vomiting, unspecified: Secondary | ICD-10-CM

## 2021-02-20 DIAGNOSIS — R109 Unspecified abdominal pain: Secondary | ICD-10-CM

## 2021-03-05 ENCOUNTER — Ambulatory Visit (HOSPITAL_COMMUNITY)
Admission: RE | Admit: 2021-03-05 | Discharge: 2021-03-05 | Disposition: A | Discharge status: Home / Self Care | Payer: 59 | Source: Ambulatory Visit | Attending: Gastroenterology | Admitting: Gastroenterology

## 2021-03-05 DIAGNOSIS — R109 Unspecified abdominal pain: Secondary | ICD-10-CM | POA: Diagnosis not present

## 2021-03-05 DIAGNOSIS — R112 Nausea with vomiting, unspecified: Secondary | ICD-10-CM

## 2021-03-05 MED ORDER — TECHNETIUM TC 99M MEBROFENIN IV KIT
5.2000 | PACK | Freq: Once | INTRAVENOUS | Status: AC | PRN
  Administered 2021-03-05: 07:00:00 5.2 via INTRAVENOUS

## 2021-06-30 IMAGING — RF DG ESOPHAGUS
3 of 4 series · 15 of 23 positions shown · non-contrast
Comparison: None.

CLINICAL DATA: Abdominal bloating and pain.

EXAM:
ESOPHOGRAM / BARIUM SWALLOW / BARIUM TABLET STUDY
TECHNIQUE: Combined double contrast and single contrast examination performed
using effervescent crystals, thick barium liquid, and thin barium
liquid. The patient was observed with fluoroscopy swallowing a 13 mm
barium sulphate tablet.
FLUOROSCOPY TIME:  Fluoroscopy Time:  0 minutes 48 seconds
Radiation Exposure Index (if provided by the fluoroscopic device):
2.0 mGy
Number of Acquired Spot Images: 0

[Series 1: sequence · 3 of 10 frames shown (1 of 2)]
[frame 1/10]
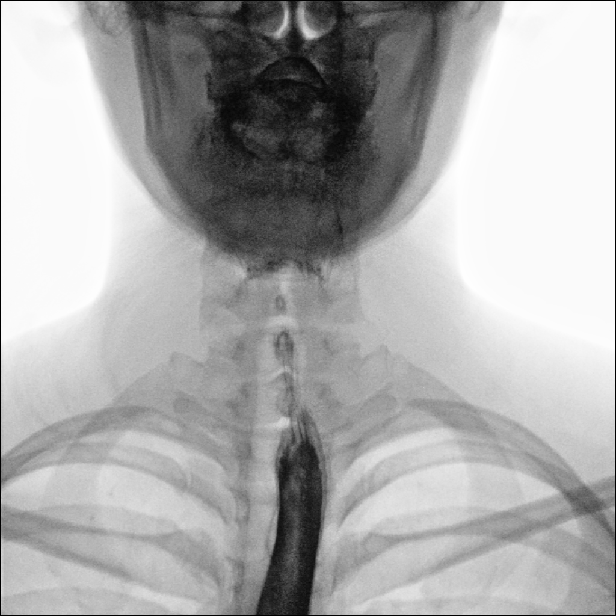
[frame 6/10]
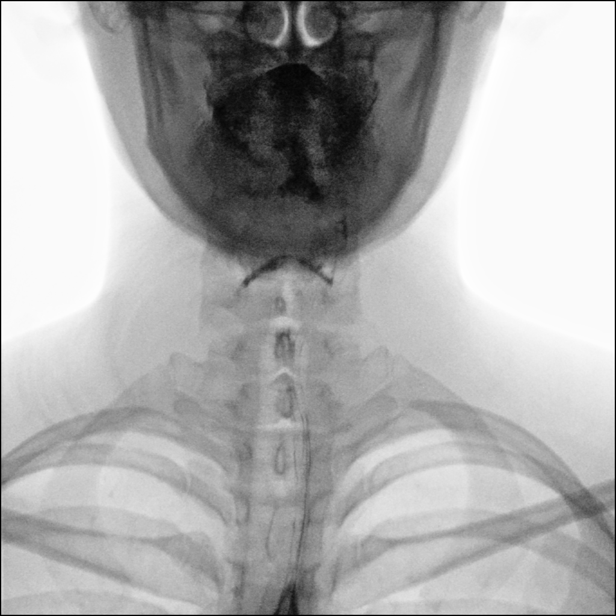
[frame 9/10]
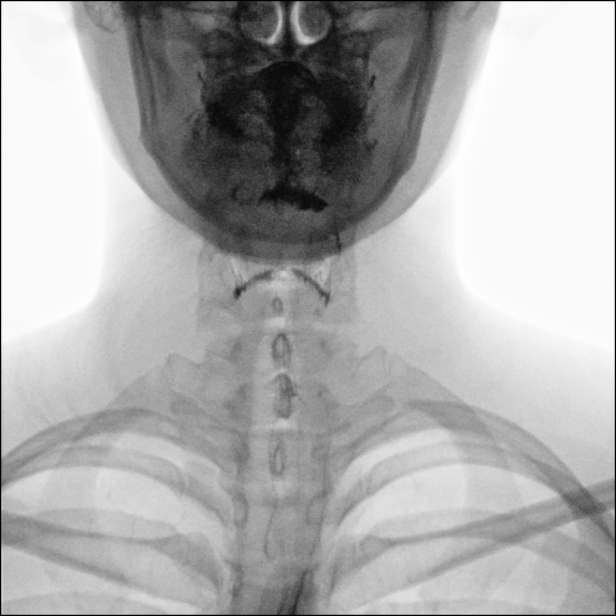

[Series 3: sequence · 3 of 14 frames shown (2 of 2)]
[frame 1/14]
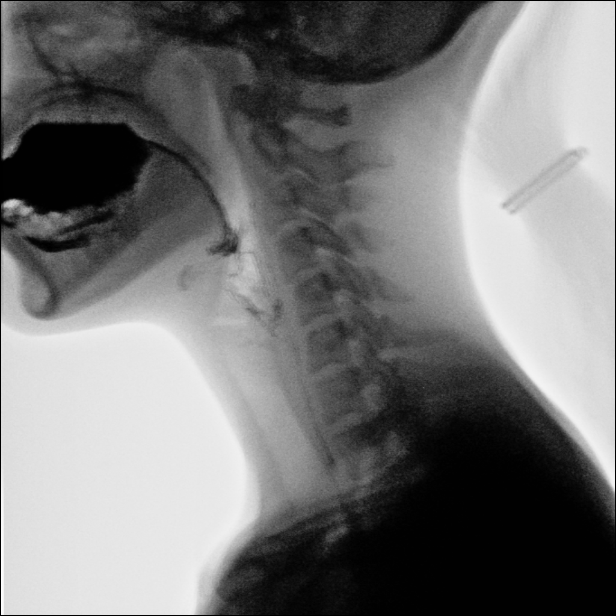
[frame 3/14]
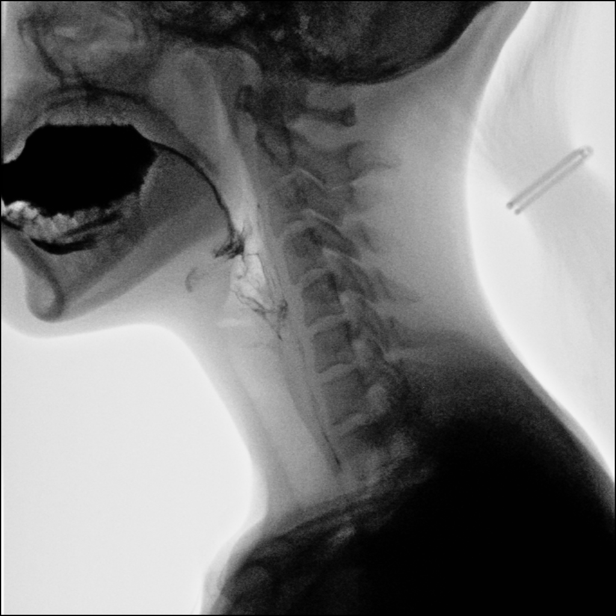
[frame 12/14]
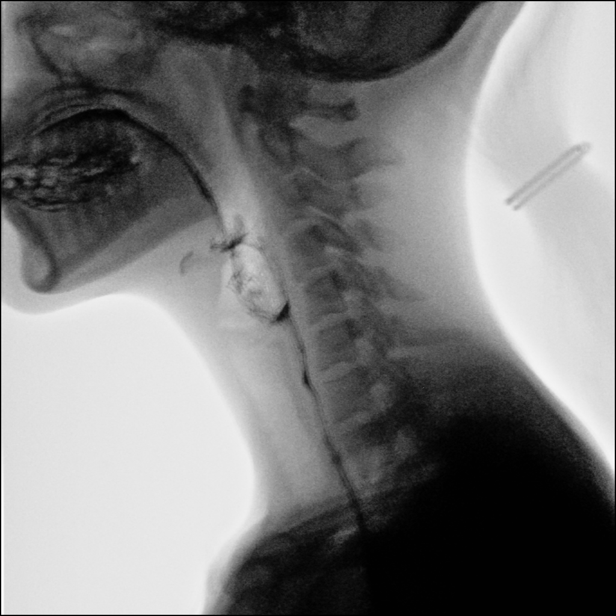

[Series 4: one shot · 9 of 14 slices shown]
[im 1/14]
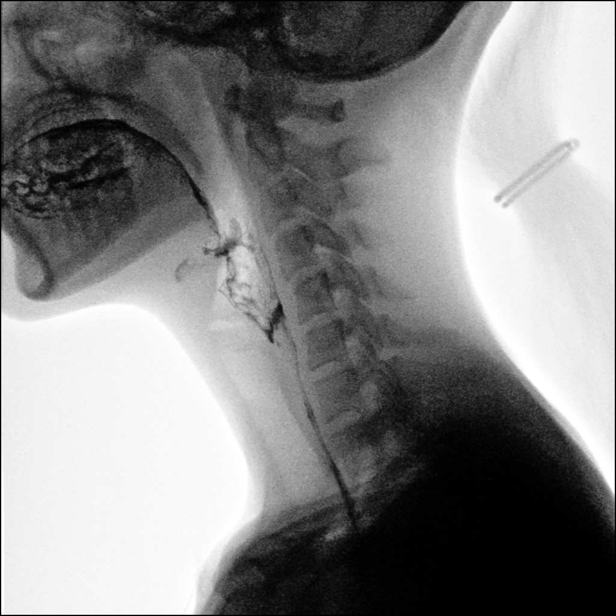
[im 3/14]
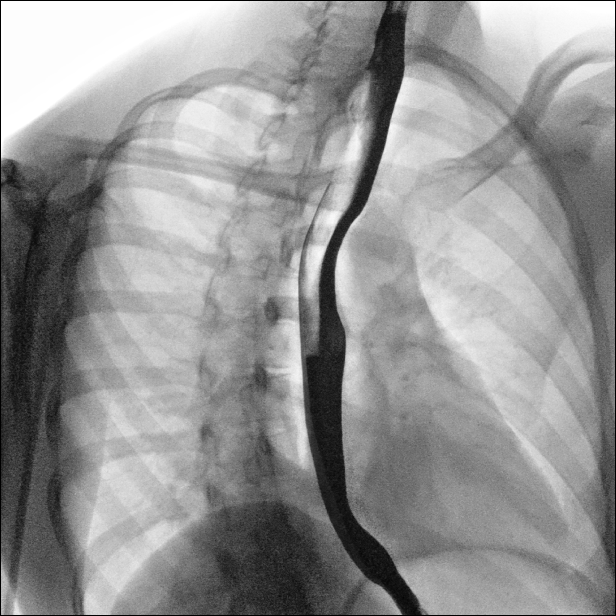
[im 5/14]
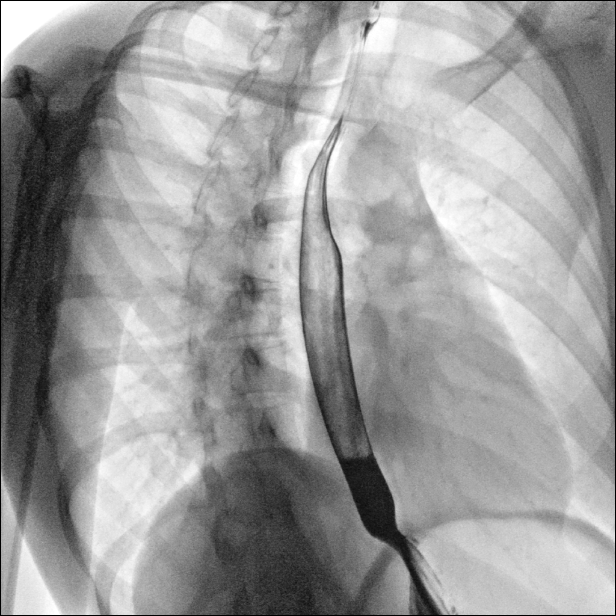
[im 6/14]
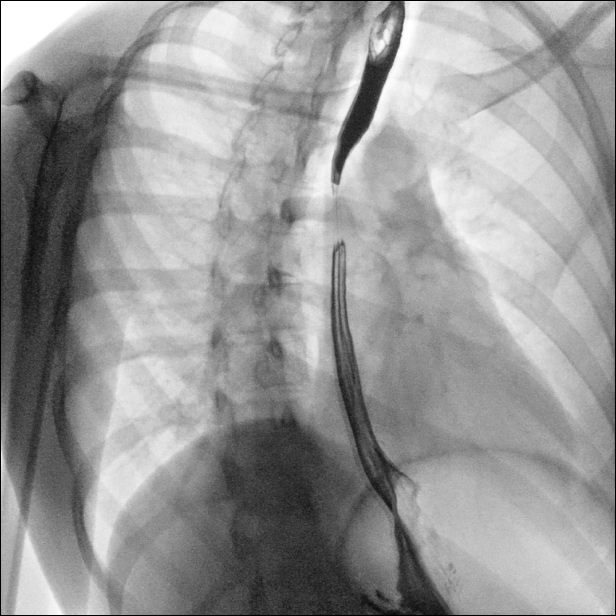
[im 8/14]
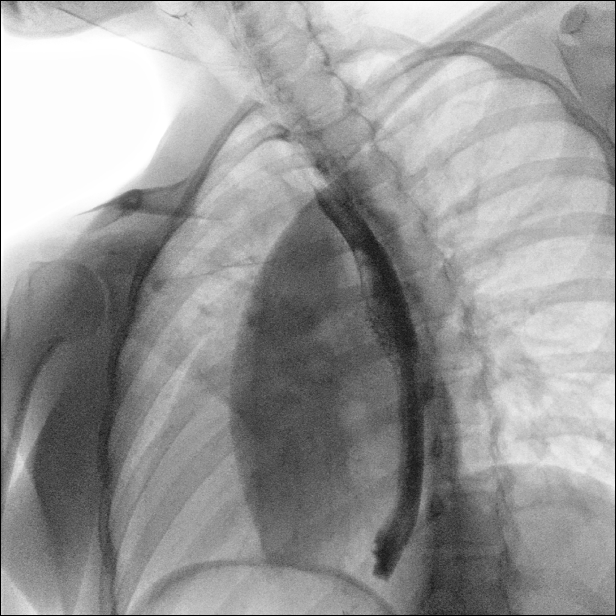
[im 9/14]
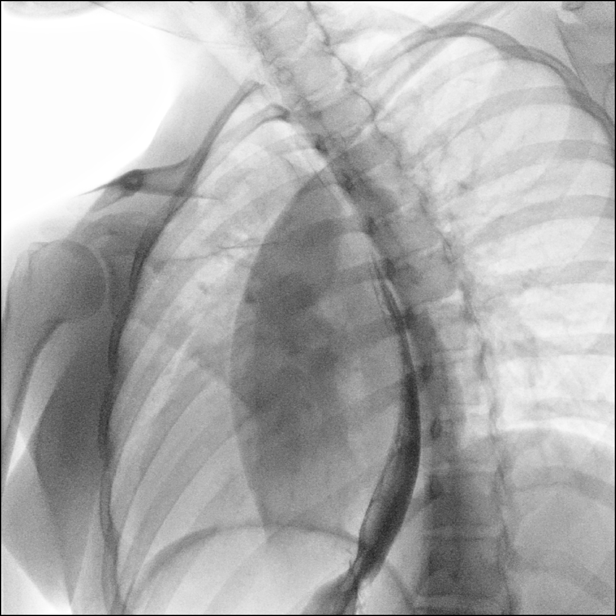
[im 11/14]
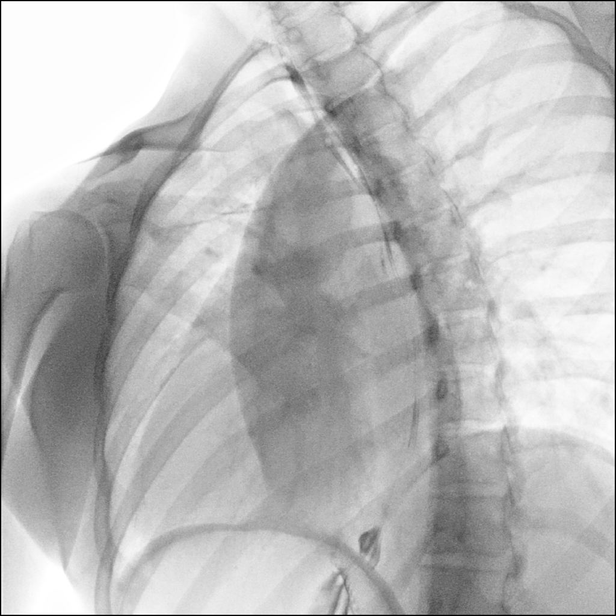
[im 12/14]
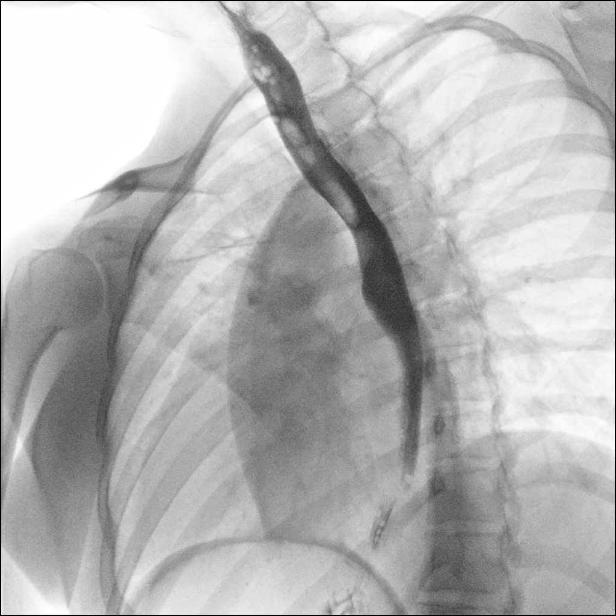
[im 14/14]
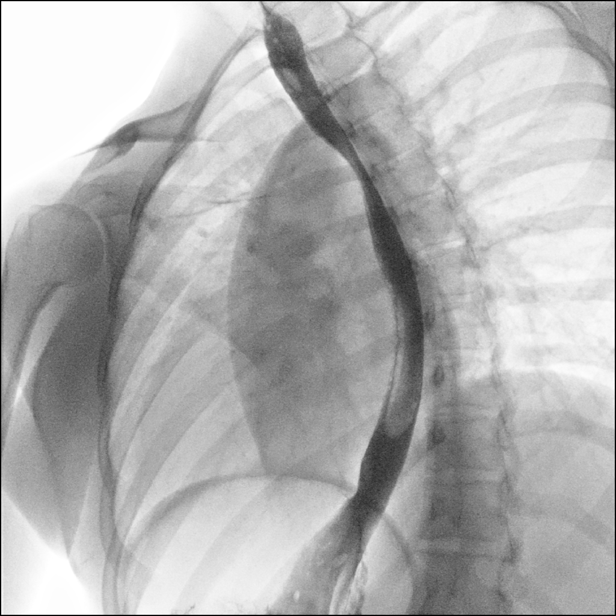

[15 of 23 positions shown; findings below may reference images not displayed]

FINDINGS: Normal swallowing mechanism. Normal esophageal motility. No
esophageal fold thickening, stricture or obstruction. A 13 mm barium
tablet passed readily into the stomach.
IMPRESSION: Normal study.

## 2022-08-18 ENCOUNTER — Other Ambulatory Visit: Payer: Self-pay

## 2022-08-18 ENCOUNTER — Emergency Department (HOSPITAL_BASED_OUTPATIENT_CLINIC_OR_DEPARTMENT_OTHER)
Admission: EM | Admit: 2022-08-18 | Discharge: 2022-08-18 | Disposition: A | Discharge status: Home / Self Care | Payer: 59 | Attending: Emergency Medicine | Admitting: Emergency Medicine

## 2022-08-18 ENCOUNTER — Encounter (HOSPITAL_BASED_OUTPATIENT_CLINIC_OR_DEPARTMENT_OTHER): Payer: Self-pay

## 2022-08-18 DIAGNOSIS — R1084 Generalized abdominal pain: Secondary | ICD-10-CM | POA: Diagnosis not present

## 2022-08-18 DIAGNOSIS — D72829 Elevated white blood cell count, unspecified: Secondary | ICD-10-CM | POA: Insufficient documentation

## 2022-08-18 DIAGNOSIS — R109 Unspecified abdominal pain: Secondary | ICD-10-CM | POA: Diagnosis present

## 2022-08-18 LAB — COMPREHENSIVE METABOLIC PANEL
ALT: 21 U/L (ref 0–44)
AST: 20 U/L (ref 15–41)
Albumin: 4.9 g/dL (ref 3.5–5.0)
Alkaline Phosphatase: 32 U/L — ABNORMAL LOW (ref 38–126)
Anion gap: 13 (ref 5–15)
BUN: 10 mg/dL (ref 6–20)
CO2: 20 mmol/L — ABNORMAL LOW (ref 22–32)
Calcium: 9.8 mg/dL (ref 8.9–10.3)
Chloride: 103 mmol/L (ref 98–111)
Creatinine, Ser: 0.53 mg/dL (ref 0.44–1.00)
GFR, Estimated: >60 mL/min (ref >60)
Glucose, Bld: 137 mg/dL — ABNORMAL HIGH (ref 70–99)
Potassium: 3.6 mmol/L (ref 3.5–5.1)
Sodium: 136 mmol/L (ref 135–145)
Total Bilirubin: 1 mg/dL (ref 0.3–1.2)
Total Protein: 7.2 g/dL (ref 6.5–8.1)

## 2022-08-18 LAB — CBC WITH DIFFERENTIAL/PLATELET
Abs Immature Granulocytes: 0.06 10*3/uL (ref 0.00–0.07)
Basophils Absolute: 0 10*3/uL (ref 0.0–0.1)
Basophils Relative: 0 %
Eosinophils Absolute: 0 10*3/uL (ref 0.0–0.5)
Eosinophils Relative: 0 %
HCT: 38 % (ref 36.0–46.0)
Hemoglobin: 13.5 g/dL (ref 12.0–15.0)
Immature Granulocytes: 0 %
Lymphocytes Relative: 4 %
Lymphs Abs: 0.6 10*3/uL — ABNORMAL LOW (ref 0.7–4.0)
MCH: 31.6 pg (ref 26.0–34.0)
MCHC: 35.5 g/dL (ref 30.0–36.0)
MCV: 89 fL (ref 80.0–100.0)
Monocytes Absolute: 0.2 10*3/uL (ref 0.1–1.0)
Monocytes Relative: 1 %
Neutro Abs: 14 10*3/uL — ABNORMAL HIGH (ref 1.7–7.7)
Neutrophils Relative %: 95 %
Platelets: 267 10*3/uL (ref 150–400)
RBC: 4.27 MIL/uL (ref 3.87–5.11)
RDW: 11.9 % (ref 11.5–15.5)
WBC: 14.9 10*3/uL — ABNORMAL HIGH (ref 4.0–10.5)
nRBC: 0 % (ref 0.0–0.2)

## 2022-08-18 LAB — URINALYSIS, ROUTINE W REFLEX MICROSCOPIC
Bilirubin Urine: NEGATIVE
Glucose, UA: NEGATIVE mg/dL
Ketones, ur: >80 mg/dL — AB
Leukocytes,Ua: NEGATIVE
Nitrite: NEGATIVE
Protein, ur: 30 mg/dL — AB
Specific Gravity, Urine: 1.026 (ref 1.005–1.030)
pH: 8.5 — ABNORMAL HIGH (ref 5.0–8.0)

## 2022-08-18 LAB — PREGNANCY, URINE: Preg Test, Ur: NEGATIVE

## 2022-08-18 LAB — LIPASE, BLOOD: Lipase: <10 U/L — ABNORMAL LOW (ref 11–51)

## 2022-08-18 MED ORDER — MORPHINE SULFATE (PF) 4 MG/ML IV SOLN
6.0000 mg | Freq: Once | INTRAVENOUS | Status: AC
  Administered 2022-08-18: 6 mg via INTRAVENOUS
  Filled 2022-08-18: qty 2

## 2022-08-18 MED ORDER — DIPHENHYDRAMINE HCL 50 MG/ML IJ SOLN
25.0000 mg | Freq: Once | INTRAMUSCULAR | Status: AC
  Administered 2022-08-18: 25 mg via INTRAVENOUS
  Filled 2022-08-18: qty 1

## 2022-08-18 MED ORDER — METOCLOPRAMIDE HCL 5 MG/ML IJ SOLN
10.0000 mg | Freq: Once | INTRAMUSCULAR | Status: AC
  Administered 2022-08-18: 10 mg via INTRAVENOUS
  Filled 2022-08-18: qty 2

## 2022-08-18 MED ORDER — METOCLOPRAMIDE HCL 10 MG PO TABS: 10.0000 mg | ORAL_TABLET | Freq: Four times a day (QID) | ORAL | 0 refills | Status: AC

## 2022-08-18 MED ORDER — DICYCLOMINE HCL 20 MG PO TABS: 20.0000 mg | ORAL_TABLET | Freq: Two times a day (BID) | ORAL | 0 refills | Status: AC

## 2022-08-18 MED ORDER — KETOROLAC TROMETHAMINE 15 MG/ML IJ SOLN
15.0000 mg | Freq: Once | INTRAMUSCULAR | Status: AC
  Administered 2022-08-18: 15 mg via INTRAVENOUS
  Filled 2022-08-18: qty 1

## 2022-08-18 MED ORDER — LACTATED RINGERS IV BOLUS
1000.0000 mL | Freq: Once | INTRAVENOUS | Status: AC
  Administered 2022-08-18: 1000 mL via INTRAVENOUS

## 2022-08-18 MED ORDER — METHOCARBAMOL 1000 MG/10ML IJ SOLN
1000.0000 mg | Freq: Once | INTRAMUSCULAR | Status: AC
  Administered 2022-08-18: 1000 mg via INTRAVENOUS
  Filled 2022-08-18: qty 10

## 2022-08-18 NOTE — ED Provider Notes (Signed)
[place] EMERGENCY DEPARTMENT AT [facility] Provider Note   CSN: [id number] Arrival date & time: 08/18/22  1750     History Chief Complaint  Patient presents with   Abdominal Cramping    HPI Dawn Esparza is a 22 y.o. female presenting for chief complaint of abdominal pain, intractable nausea vomiting.  She has a history of extensive nausea vomiting anytime she undergoes any painful procedure.  Today she had her IUD replaced.  It required manual dilation states that it was exquisitely painful.  She has been having abdominal wall spasms and intractable nausea and vomiting all day not responsive to suppository or p.o medications. Nonbilious nonbloody vomiting, copious.  Patient feels dehydrated and fatigued..   Patient's recorded medical, surgical, social, medication list and allergies were reviewed in the Snapshot window as part of the initial history.   Review of Systems   Review of Systems  Constitutional:  Negative for chills and fever.  HENT:  Negative for ear pain and sore throat.   Eyes:  Negative for pain and visual disturbance.  Respiratory:  Negative for cough and shortness of breath.   Cardiovascular:  Negative for chest pain and palpitations.  Gastrointestinal:  Positive for abdominal pain, nausea and vomiting.  Genitourinary:  Negative for dysuria and hematuria.  Musculoskeletal:  Negative for arthralgias and back pain.  Skin:  Negative for color change and rash.  Neurological:  Negative for seizures and syncope.  All other systems reviewed and are negative.   Physical Exam Updated Vital Signs BP 109/63   Pulse 88   Temp 98.6 F (37 C) (Oral)   Resp 16   Ht 5\' 4"  (1.626 m)   Wt 61.2 kg   SpO2 98%   BMI 23.17 kg/m  Physical Exam Vitals and nursing note reviewed.  Constitutional:      General: She is not in acute distress.    Appearance: She is well-developed.  HENT:     Head: Normocephalic and atraumatic.  Eyes:     Conjunctiva/sclera:  Conjunctivae normal.  Cardiovascular:     Rate and Rhythm: Normal rate and regular rhythm.     Heart sounds: No murmur heard. Pulmonary:     Effort: Pulmonary effort is normal. No respiratory distress.     Breath sounds: Normal breath sounds.  Abdominal:     General: There is no distension.     Palpations: Abdomen is soft.     Tenderness: There is no abdominal tenderness. There is no right CVA tenderness or left CVA tenderness.  Musculoskeletal:        General: No swelling or tenderness. Normal range of motion.     Cervical back: Neck supple.  Skin:    General: Skin is warm and dry.  Neurological:     General: No focal deficit present.     Mental Status: She is alert and oriented to person, place, and time. Mental status is at baseline.     Cranial Nerves: No cranial nerve deficit.      ED Course/ Medical Decision Making/ A&P    Procedures Procedures   Medications Ordered in ED Medications  metoCLOPramide (REGLAN) injection 10 mg (10 mg Intravenous Given 08/18/22 1827)  lactated ringers bolus 1,000 mL (0 mLs Intravenous Stopped 08/18/22 1934)  morphine (PF) 4 MG/ML injection 6 mg (6 mg Intravenous Given 08/18/22 1829)  ketorolac (TORADOL) 15 MG/ML injection 15 mg (15 mg Intravenous Given 08/18/22 1828)  diphenhydrAMINE (BENADRYL) injection 25 mg (25 mg Intravenous Given 08/18/22 1828)  methocarbamol (ROBAXIN) injection 1,000 mg (1,000 mg Intravenous Given 08/18/22 1936)   Medical Decision Making:   Dawn Esparza is a 22 y.o. female who presented to the ED today with abdominal pain, detailed above.    Additional history discussed with patient's family/caregivers.  Patient placed on continuous vitals and telemetry monitoring while in ED which was reviewed periodically.  Complete initial physical exam performed, notably the patient  was HDS in NAD.     Reviewed and confirmed nursing documentation for past medical history, family history, social history.    Initial Assessment:    With the patient's presentation of abdominal pain, most likely diagnosis is nonspecific etiology. Other diagnoses were considered including (but not limited to) gastroenteritis, colitis, small bowel obstruction, appendicitis, cholecystitis, pancreatitis, nephrolithiasis, UTI, pyleonephritis, ruptured ectopic pregnancy, PID, ovarian torsion. These are considered less likely due to history of present illness and physical exam findings.   This is most consistent with an acute life/limb threatening illness complicated by underlying chronic conditions.   Initial Plan:  CBC/CMP to evaluate for underlying infectious/metabolic etiology for patient's abdominal pain  Lipase to evaluate for pancreatitis  EKG to evaluate for cardiac source of pain  Considered CTAB/Pelvis with contrast to evaluate for structural/surgical etiology of patients' severe abdominal pain.  However, patient is already undergone CT scans with similar presentations in the past which have been nondiagnostic.  Given her young age, multiple radiographic exposures to the abdomen pelvis, risk of repeat imaging versus benefits given low pretest probability of this imaging was shared with the patient patient agreed with symptomatic management at this time. Urinalysis and repeat physical assessment to evaluate for UTI/Pyelonpehritis  Empiric management of symptoms with escalating pain control and antiemetics as needed.   Initial Study Results:   Laboratory  All laboratory results reviewed without evidence of clinically relevant pathology.   Exceptions include: Leukocytosis to 14, down from prior presentations with leukocytosis and 17 likely attributed to her refractory nausea vomiting   EKG EKG was reviewed independently. Rate, rhythm, axis, intervals all examined and without medically relevant abnormality. ST segments without concerns for elevations.    Reassessment and Plan:   Reassessed at bedside.  After extensive treatment, patient is  endorsing substantial improvement. States that it no longer feels like nausea but more of just a cramping discomfort which she attributes to the procedure earlier today.  Will treat with muscle relaxer IV and plan for third reassessment   Reassessment: On third reassessment pain is under control, patient resting comfortably.  Will refer back to primary gynecologist for ongoing outpatient care and management.  Disposition:  I have considered need for hospitalization, however, considering all of the above, I believe this patient is stable for discharge at this time.  Patient/family educated about specific return precautions for given chief complaint and symptoms.  Patient/family educated about follow-up with PCP.     Patient/family expressed understanding of return precautions and need for follow-up. Patient spoken to regarding all imaging and laboratory results and appropriate follow up for these results. All education provided in verbal form with additional information in written form. Time was allowed for answering of patient questions. Patient discharged.    Emergency Department Medication Summary:   Medications  metoCLOPramide (REGLAN) injection 10 mg (10 mg Intravenous Given 08/18/22 1827)  lactated ringers bolus 1,000 mL (0 mLs Intravenous Stopped 08/18/22 1934)  morphine (PF) 4 MG/ML injection 6 mg (6 mg Intravenous Given 08/18/22 1829)  ketorolac (TORADOL) 15 MG/ML injection 15 mg (15 mg Intravenous Given  08/18/22 1828)  diphenhydrAMINE (BENADRYL) injection 25 mg (25 mg Intravenous Given 08/18/22 1828)  methocarbamol (ROBAXIN) injection 1,000 mg (1,000 mg Intravenous Given 08/18/22 1936)    Clinical Impression:  1. Generalized abdominal pain      Discharge   Final Clinical Impression(s) / ED Diagnoses Final diagnoses:  Generalized abdominal pain    Rx / DC Orders ED Discharge Orders          Ordered    metoCLOPramide (REGLAN) 10 MG tablet  Every 6 hours        08/18/22 2047     dicyclomine (BENTYL) 20 MG tablet  2 times daily        08/18/22 2047              Glyn Ade, MD 08/18/22 2258

## 2022-08-18 NOTE — ED Triage Notes (Signed)
Patient here POV from Home.  IUD removed and inserted this AM. Stated it Cervix was dilated for procedure and since then she began to have Epigastric ABD cramping. Occurred at 1015 this AM.   NAD Noted during Triage. A&Ox4. GCS 15. Ambulatory.
